# Patient Record
Sex: Male | Born: 2000 | Race: White | Hispanic: No | Marital: Single | Smoking: Never smoker
Health system: Southern US, Community
[De-identification: ages and names within clinical notes are randomized; demographics above are authoritative.]

## PROBLEM LIST (undated history)

## (undated) DIAGNOSIS — J45909 Unspecified asthma, uncomplicated: Secondary | ICD-10-CM

## (undated) DIAGNOSIS — J309 Allergic rhinitis, unspecified: Secondary | ICD-10-CM

## (undated) DIAGNOSIS — K219 Gastro-esophageal reflux disease without esophagitis: Secondary | ICD-10-CM

## (undated) HISTORY — DX: Allergic rhinitis, unspecified: J30.9

## (undated) HISTORY — DX: Gastro-esophageal reflux disease without esophagitis: K21.9

## (undated) HISTORY — DX: Unspecified asthma, uncomplicated: J45.909

## (undated) HISTORY — PX: CIRCUMCISION: SHX1350

---
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
## 2020-01-07 ENCOUNTER — Ambulatory Visit (INDEPENDENT_AMBULATORY_CARE_PROVIDER_SITE_OTHER): Payer: Medicaid Other

## 2020-01-07 DIAGNOSIS — J309 Allergic rhinitis, unspecified: Secondary | ICD-10-CM | POA: Diagnosis not present

## 2020-01-14 ENCOUNTER — Other Ambulatory Visit: Payer: Self-pay

## 2020-01-14 ENCOUNTER — Ambulatory Visit (INDEPENDENT_AMBULATORY_CARE_PROVIDER_SITE_OTHER): Payer: Medicaid Other | Admitting: *Deleted

## 2020-01-14 DIAGNOSIS — J4550 Severe persistent asthma, uncomplicated: Secondary | ICD-10-CM

## 2020-01-28 ENCOUNTER — Ambulatory Visit (INDEPENDENT_AMBULATORY_CARE_PROVIDER_SITE_OTHER): Payer: Medicaid Other | Admitting: *Deleted

## 2020-01-28 ENCOUNTER — Other Ambulatory Visit: Payer: Self-pay

## 2020-01-28 DIAGNOSIS — J4550 Severe persistent asthma, uncomplicated: Secondary | ICD-10-CM

## 2020-02-11 ENCOUNTER — Ambulatory Visit (INDEPENDENT_AMBULATORY_CARE_PROVIDER_SITE_OTHER): Payer: Medicaid Other | Admitting: *Deleted

## 2020-02-11 ENCOUNTER — Other Ambulatory Visit: Payer: Self-pay

## 2020-02-11 DIAGNOSIS — J4550 Severe persistent asthma, uncomplicated: Secondary | ICD-10-CM

## 2020-02-18 ENCOUNTER — Ambulatory Visit (INDEPENDENT_AMBULATORY_CARE_PROVIDER_SITE_OTHER): Payer: Medicaid Other | Admitting: *Deleted

## 2020-02-18 DIAGNOSIS — J309 Allergic rhinitis, unspecified: Secondary | ICD-10-CM

## 2020-02-25 ENCOUNTER — Ambulatory Visit (INDEPENDENT_AMBULATORY_CARE_PROVIDER_SITE_OTHER): Payer: Medicaid Other

## 2020-02-25 ENCOUNTER — Other Ambulatory Visit: Payer: Self-pay

## 2020-02-25 DIAGNOSIS — J4550 Severe persistent asthma, uncomplicated: Secondary | ICD-10-CM

## 2020-03-10 ENCOUNTER — Ambulatory Visit (INDEPENDENT_AMBULATORY_CARE_PROVIDER_SITE_OTHER): Payer: Medicaid Other | Admitting: *Deleted

## 2020-03-10 ENCOUNTER — Other Ambulatory Visit: Payer: Self-pay

## 2020-03-10 DIAGNOSIS — J4550 Severe persistent asthma, uncomplicated: Secondary | ICD-10-CM

## 2020-03-17 ENCOUNTER — Ambulatory Visit (INDEPENDENT_AMBULATORY_CARE_PROVIDER_SITE_OTHER): Payer: Medicaid Other

## 2020-03-17 DIAGNOSIS — J309 Allergic rhinitis, unspecified: Secondary | ICD-10-CM

## 2020-03-25 ENCOUNTER — Ambulatory Visit: Payer: Medicaid Other

## 2020-03-31 ENCOUNTER — Other Ambulatory Visit: Payer: Self-pay

## 2020-03-31 ENCOUNTER — Ambulatory Visit (INDEPENDENT_AMBULATORY_CARE_PROVIDER_SITE_OTHER): Payer: Medicaid Other | Admitting: Allergy and Immunology

## 2020-03-31 ENCOUNTER — Encounter: Payer: Self-pay | Admitting: Allergy and Immunology

## 2020-03-31 VITALS — BP 132/78 | HR 76 | Resp 20 | Ht 72.0 in | Wt 215.2 lb

## 2020-03-31 DIAGNOSIS — K219 Gastro-esophageal reflux disease without esophagitis: Secondary | ICD-10-CM | POA: Diagnosis not present

## 2020-03-31 DIAGNOSIS — J4550 Severe persistent asthma, uncomplicated: Secondary | ICD-10-CM | POA: Diagnosis not present

## 2020-03-31 DIAGNOSIS — J309 Allergic rhinitis, unspecified: Secondary | ICD-10-CM | POA: Diagnosis not present

## 2020-03-31 NOTE — Progress Notes (Signed)
[place] - [place]   Follow-up Note  Referring Provider: Abner Greenspan, MD Primary Provider: Abner Greenspan, MD Date of Office Visit: 03/31/2020  Subjective:   Matthew Willis (DOB: 2000/11/30) is a 19 y.o. male who [facility] on 03/31/2020 in re-evaluation of the following:  HPI: Matthew Willis returns to this clinic in evaluation of asthma and allergic rhinitis and reflux.  His last visit to this clinic was 15 November 2019.  He continues to do very well with his airway and has not had the need to use a systemic steroid or an antibiotic to treat an airway issue since his last visit in this clinic.  He rarely uses a short acting bronchodilator and he can exercise without any problem.  He is currently using his Dulera twice a day.  He intermittently uses a nasal steroid.  He has not been having any problems with reflux while using omeprazole intermittently.  He continues on immunotherapy without any adverse effect every 2 weeks and continues on Xolair.  He has received 2 Moderna Covid vaccinations.  Allergies as of 03/31/2020   No Known Allergies     Medication List      albuterol (2.5 MG/3ML) 0.083% nebulizer solution Commonly known as: PROVENTIL Take 3 mLs (2.5 mg total) by nebulization every 4 (four) hours as needed for wheezing or shortness of breath.   ProAir HFA 108 (90 Base) MCG/ACT inhaler Generic drug: albuterol INHALE 2 PUFFS BY MOUTH EVERY 4-6 HOURS AS NEEDED FOR COUGH AND WHEEZING   cetirizine 10 MG tablet Commonly known as: ZYRTEC Take 10 mg by mouth daily.   Dulera 200-5 MCG/ACT Aero Generic drug: mometasone-formoterol Inhale two puffs twice daily as directed to prevent cough or wheeze.  Rinse, gargle, and spit after use.   EPINEPHrine 0.3 mg/0.3 mL Soaj injection Commonly known as: EPI-PEN Use as directed for life-threatening allergic reaction.   fluticasone 110 MCG/ACT inhaler Commonly known  as: Flovent HFA Inhale 2 sprays two times daily during "flare up." Rinse, gargle and spit after each use.   fluticasone 50 MCG/ACT nasal spray Commonly known as: FLONASE Can use one spray in each nostril once daily as needed.   omeprazole 20 MG capsule Commonly known as: PRILOSEC Can take one capsule by mouth once daily if needed.   Xolair 150 MG injection Generic drug: omalizumab INJECT 225 MG SUBCUTANEOUSLY EVERY 2 WEEKS.       Past Medical History:  Diagnosis Date  . Allergic rhinitis   . Asthma   . GERD (gastroesophageal reflux disease)     Past Surgical History:  Procedure Laterality Date  . ADENOIDECTOMY  07/2011  . CIRCUMCISION    . TONSILLECTOMY  07/2011  . TONSILLECTOMY AND ADENOIDECTOMY Bilateral 08/18/2011    Review of systems negative except as noted in HPI / PMHx or noted below:  Review of Systems  Constitutional: Negative.   HENT: Negative.   Eyes: Negative.   Respiratory: Negative.   Cardiovascular: Negative.   Gastrointestinal: Negative.   Genitourinary: Negative.   Musculoskeletal: Negative.   Skin: Negative.   Neurological: Negative.   Endo/Heme/Allergies: Negative.   Psychiatric/Behavioral: Negative.      Objective:   Vitals:   03/31/20 1543  BP: 132/78  Pulse: 76  Resp: 20  SpO2: 98%   Height: 6' (182.9 cm)  Weight: 215 lb 3.2 oz (97.6 kg)   Physical Exam Constitutional:      Appearance: He is not  diaphoretic.  HENT:     Head: Normocephalic.     Right Ear: Tympanic membrane, ear canal and external ear normal.     Left Ear: Tympanic membrane, ear canal and external ear normal.     Nose: Nose normal. No mucosal edema or rhinorrhea.     Mouth/Throat:     Pharynx: Uvula midline. No oropharyngeal exudate.  Eyes:     Conjunctiva/sclera: Conjunctivae normal.  Neck:     Thyroid: No thyromegaly.     Trachea: Trachea normal. No tracheal tenderness or tracheal deviation.  Cardiovascular:     Rate and Rhythm: Normal rate and  regular rhythm.     Heart sounds: Normal heart sounds, S1 normal and S2 normal. No murmur.  Pulmonary:     Effort: No respiratory distress.     Breath sounds: Normal breath sounds. No stridor. No wheezing or rales.  Lymphadenopathy:     Head:     Right side of head: No tonsillar adenopathy.     Left side of head: No tonsillar adenopathy.     Cervical: No cervical adenopathy.  Skin:    Findings: No erythema or rash.     Nails: There is no clubbing.  Neurological:     Mental Status: He is alert.     Diagnostics:    Spirometry was performed and demonstrated an FEV1 of 4.29 at 89 % of predicted.  Assessment and Plan:   1. Severe persistent asthma without complication   2. Allergic rhinitis, unspecified seasonality, unspecified trigger   3. LPRD (laryngopharyngeal reflux disease)     1. Continue Dulera 200 - two inhalations 1-2 times per day with spacer during asthma activity  2. Continue Flonase - one spray each nostril 3-7 times per week during periods of upper airway activity  3. Continue omeprazole 20mg  3-7 times per week during reflux activity  4. Can use nasal saline, zyrtec, proair hfa and EpiPen if needed.   5. ADD Flovent 110 two inhalations two times a day to [facility] during 'flare up'   6. Return in 6 months or earlier if problem.  Matthew Willis appears to be doing very well on his current therapy.  He has undergone over 5 years of immunotherapy and I think it is time to see if he is a long-term responder and will discontinue this form of treatment.  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  He has a good understanding of his medications and how they work and appropriate dosing of his medications.  Assuming he does well I will see him back in his clinic in 6 months or earlier if there is a problem.  Matthew Katz,  MD Allergy / Immunology [place]

## 2020-03-31 NOTE — Patient Instructions (Addendum)
  1. Continue Dulera 200 - two inhalations 1-2 times per day with spacer during asthma activity  2. Continue Flonase - one spray each nostril 3-7 times per week during periods of upper airway activity  3. Continue omeprazole 20mg  3-7 times per week during reflux activity  4. Can use nasal saline, zyrtec, proair hfa and EpiPen if needed.   5. ADD Flovent 110 two inhalations two times a day to [facility] during 'flare up'   6. Return in 6 months or earlier if problem.

## 2020-04-01 ENCOUNTER — Encounter: Payer: Self-pay | Admitting: Allergy and Immunology

## 2020-04-14 ENCOUNTER — Ambulatory Visit: Payer: Medicaid Other
